# Patient Record
Sex: Female | Born: 1990 | Race: White | Hispanic: No | Marital: Married | Smoking: Never smoker
Health system: Southern US, Community
[De-identification: ages and names within clinical notes are randomized; demographics above are authoritative.]

## PROBLEM LIST (undated history)

## (undated) HISTORY — PX: WISDOM TOOTH EXTRACTION: SHX21

---

## 2016-11-13 ENCOUNTER — Emergency Department (INDEPENDENT_AMBULATORY_CARE_PROVIDER_SITE_OTHER): Payer: Commercial Managed Care - HMO

## 2016-11-13 ENCOUNTER — Encounter: Payer: Self-pay | Admitting: Emergency Medicine

## 2016-11-13 ENCOUNTER — Emergency Department
Admission: EM | Admit: 2016-11-13 | Discharge: 2016-11-13 | Disposition: A | Discharge status: Home / Self Care | Payer: Commercial Managed Care - HMO | Source: Home / Self Care | Attending: Family Medicine | Admitting: Family Medicine

## 2016-11-13 DIAGNOSIS — M25552 Pain in left hip: Secondary | ICD-10-CM

## 2016-11-13 DIAGNOSIS — M545 Low back pain, unspecified: Secondary | ICD-10-CM

## 2016-11-13 LAB — POCT URINE PREGNANCY: Preg Test, Ur: NEGATIVE

## 2016-11-13 MED ORDER — CYCLOBENZAPRINE HCL 10 MG PO TABS: 10.0000 mg | ORAL_TABLET | Freq: Two times a day (BID) | ORAL | 0 refills | Status: AC | PRN

## 2016-11-13 MED ORDER — HYDROCODONE-ACETAMINOPHEN 5-325 MG PO TABS: 1.0000 | ORAL_TABLET | Freq: Four times a day (QID) | ORAL | 0 refills | Status: AC | PRN

## 2016-11-13 NOTE — ED Provider Notes (Signed)
CSN: [id number]     Arrival date & time 11/13/16  1120 History   First MD Initiated Contact with Patient 11/13/16 1154     Chief Complaint  Patient presents with  . Hip Pain   (Consider location/radiation/quality/duration/timing/severity/associated sxs/prior Treatment) HPI  Veronica Robinson is a 26 y.o. female presenting to UC with c/o Left hip pain that started yesterday after she tripped and fell going up some brick steps into her house while carrying her child in her Left arm and grocery bags in her Right.  She notes she was focused on protecting her daughter during the fall and does not recall hitting her hip on anything but it is very sore, no relief with ibuprofen.  Pain is 7/10 at this time, worse with certain movements and in certain positions. She notes she has been advised she has lot bone density as well as her 26 year old sister. Pt notes she fractured her Right patella after "not doing much of anything" a few years ago so she wants to make sure she did not fracture her Left hip.  No radiation of pain into lower leg. No change in bowel or bladder habits.    History reviewed. No pertinent past medical history. Past Surgical History:  Procedure Laterality Date  . CESAREAN SECTION    . WISDOM TOOTH EXTRACTION     No family history on file. Social History  Substance Use Topics  . Smoking status: Never Smoker  . Smokeless tobacco: Never Used  . Alcohol use No   OB History    No data available     Review of Systems  Musculoskeletal: Positive for arthralgias, back pain and myalgias. Negative for gait problem, joint swelling and neck pain.  Skin: Negative for color change and rash.  Neurological: Negative for weakness and numbness.    Allergies  Patient has no known allergies.  Home Medications   Prior to Admission medications   Medication Sig Start Date End Date Taking? Authorizing Provider  clomiPRAMINE (ANAFRANIL) 25 MG capsule Take 25 mg by mouth at bedtime.   Yes  [provider]  LamoTRIgine (LAMICTAL PO) Take by mouth.   Yes [provider]  norethindrone-ethinyl estradiol (FEMHRT 1/5) 1-5 MG-MCG TABS tablet Take by mouth daily.   Yes [provider]  cyclobenzaprine (FLEXERIL) 10 MG tablet Take 1 tablet (10 mg total) by mouth 2 (two) times daily as needed. 11/13/16   Junius Finner'Malley, Emberlie Gotcher, PA-C  HYDROcodone-acetaminophen (NORCO/VICODIN) 5-325 MG tablet Take 1-2 tablets by mouth every 6 (six) hours as needed for moderate pain or severe pain. 11/13/16   Junius Finner'Malley, Felesha Moncrieffe, PA-C   Meds Ordered and Administered this Visit  Medications - No data to display  BP 139/84 (BP Location: Left Arm)   Pulse 86   Temp 98.4 F (36.9 C) (Oral)   Ht 5\' 1"  (1.549 m)   Wt 206 lb (93.4 kg)   LMP 11/10/2016 (Exact Date)   BMI 38.92 kg/m  No data found.   Physical Exam  Constitutional: She is oriented to person, place, and time. She appears well-developed and well-nourished. No distress.  HENT:  Head: Normocephalic and atraumatic.  Eyes: EOM are normal.  Neck: Normal range of motion. Neck supple.  No midline bone tenderness, no crepitus or step-offs.   Cardiovascular: Normal rate.   Pulmonary/Chest: Effort normal.  Musculoskeletal: Normal range of motion. She exhibits tenderness. She exhibits no edema.  Tenderness to lower lumbar spine and Left side lower back. Tenderness to Left hip.  Unable to palpate bone due to body habitus.  Full ROM upper and lower extremities   Neurological: She is alert and oriented to person, place, and time.  Skin: Skin is warm and dry. Capillary refill takes less than 2 seconds. She is not diaphoretic.  Psychiatric: She has a normal mood and affect. Her behavior is normal.  Nursing note and vitals reviewed.   Urgent Care Course     Procedures (including critical care time)  Labs Review Labs Reviewed  POCT URINE PREGNANCY    Imaging Review Dg Lumbar Spine Complete  Result Date: 11/13/2016 CLINICAL DATA:   Pt states she tripped and fell yesterday and landed on some brick steps and injured her lower back. Complaining of left-sided low back pain. Pain increases when bearing weight. EXAM: LUMBAR SPINE - COMPLETE 4+ VIEW COMPARISON:  None. FINDINGS: There is no evidence of lumbar spine fracture. Alignment is normal. Intervertebral disc spaces are maintained. IMPRESSION: Negative. Electronically Signed   By: Amie Portland M.D.   On: 11/13/2016 12:30   Dg Hip Unilat W Or Wo Pelvis 2-3 Views Left  Result Date: 11/13/2016 CLINICAL DATA:  Pt states she tripped and fell yesterday and landed on some brick steps and injured her left hip. C/o lateral left hip pain. Pain increases when bearing weight. EXAM: DG HIP (WITH OR WITHOUT PELVIS) 2-3V LEFT COMPARISON:  None. FINDINGS: There is no evidence of hip fracture or dislocation. There is no evidence of arthropathy or other focal bone abnormality. IMPRESSION: Negative. Electronically Signed   By: Amie Portland M.D.   On: 11/13/2016 12:29     MDM   1. Left hip pain   2. Acute left-sided low back pain without sciatica    Plain films: no acute injury.  Reassured pt. Advised she likely strained the muscles in lower back and Left hip.  Rx: Flexeril and norco Continue to take ibuprofen. F/u with Sports Medicine later this week if not improving.     Junius Finner, PA-C 11/13/16 [phone]

## 2016-11-13 NOTE — ED Triage Notes (Signed)
Pt fell yesterday, injured left hip and leg now radiates down to knee cap.  No relief from ice, heat or ibuprofen.

## 2016-11-13 NOTE — Discharge Instructions (Signed)
°  Norco/Vicodin (hydrocodone-acetaminophen) is a narcotic pain medication, do not combine these medications with others containing tylenol. While taking, do not drink alcohol, drive, or perform any other activities that requires focus while taking these medications.   Flexeril is a muscle relaxer and may cause drowsiness. Do not drink alcohol, drive, or operate heavy machinery while taking.  You may take 400-600mg  Ibuprofen every 6-8 hours as needed for pain as well, especially during the day.

## 2018-09-28 IMAGING — DX DG LUMBAR SPINE COMPLETE 4+V
5 series · 5 of 5 positions shown · non-contrast
Comparison: None.

CLINICAL DATA: Pt states she tripped and fell yesterday and landed
on some brick steps and injured her lower back. Complaining of
left-sided low back pain. Pain increases when bearing weight.

EXAM:
LUMBAR SPINE - COMPLETE 4+ VIEW

[l-spine ap]
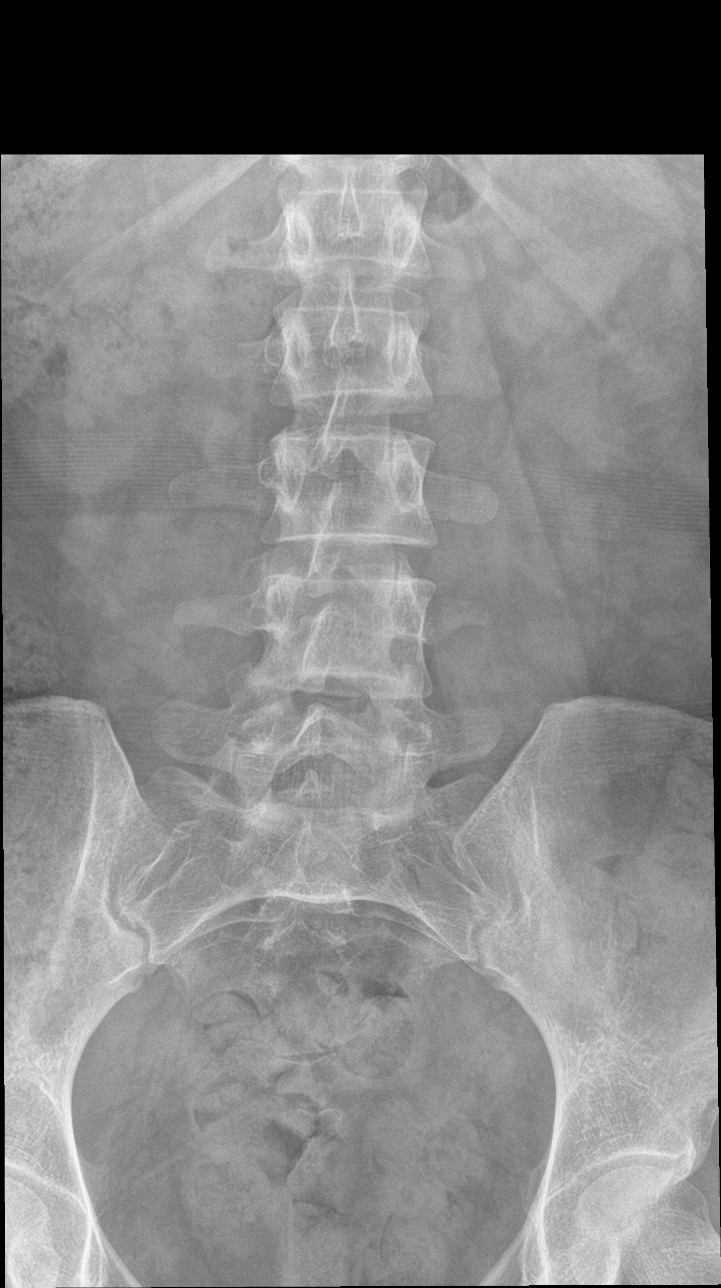

[l-spine obl (1 of 2)]
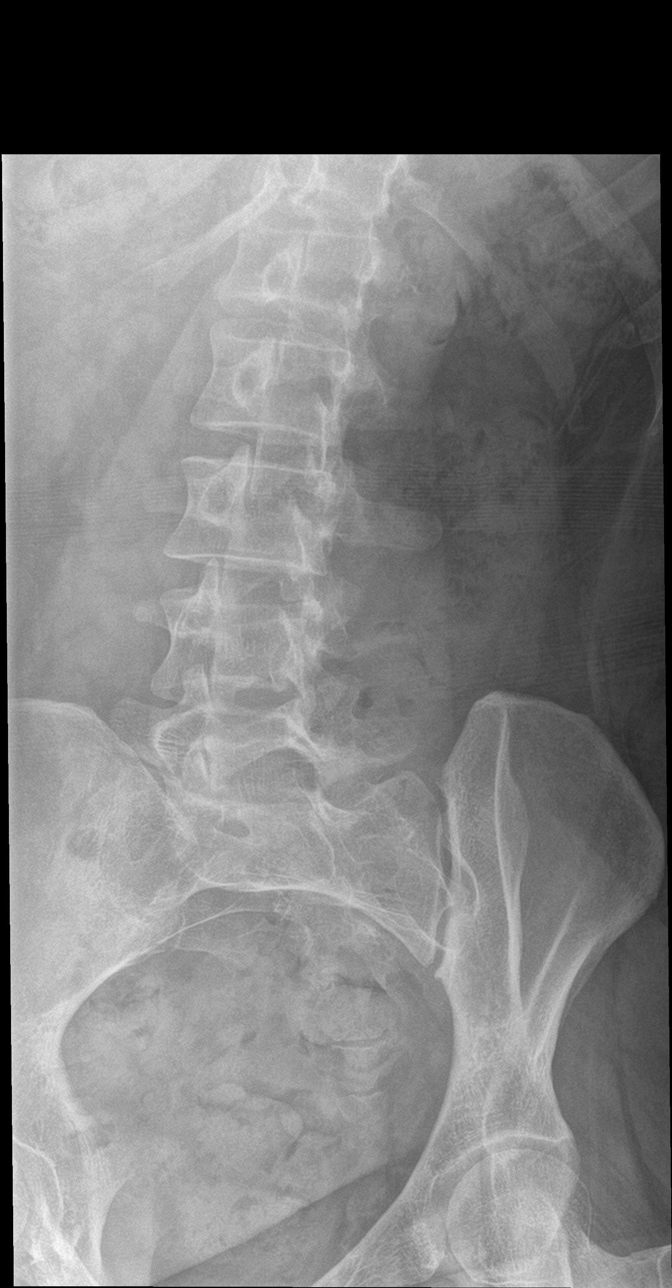

[l-spine obl (2 of 2)]
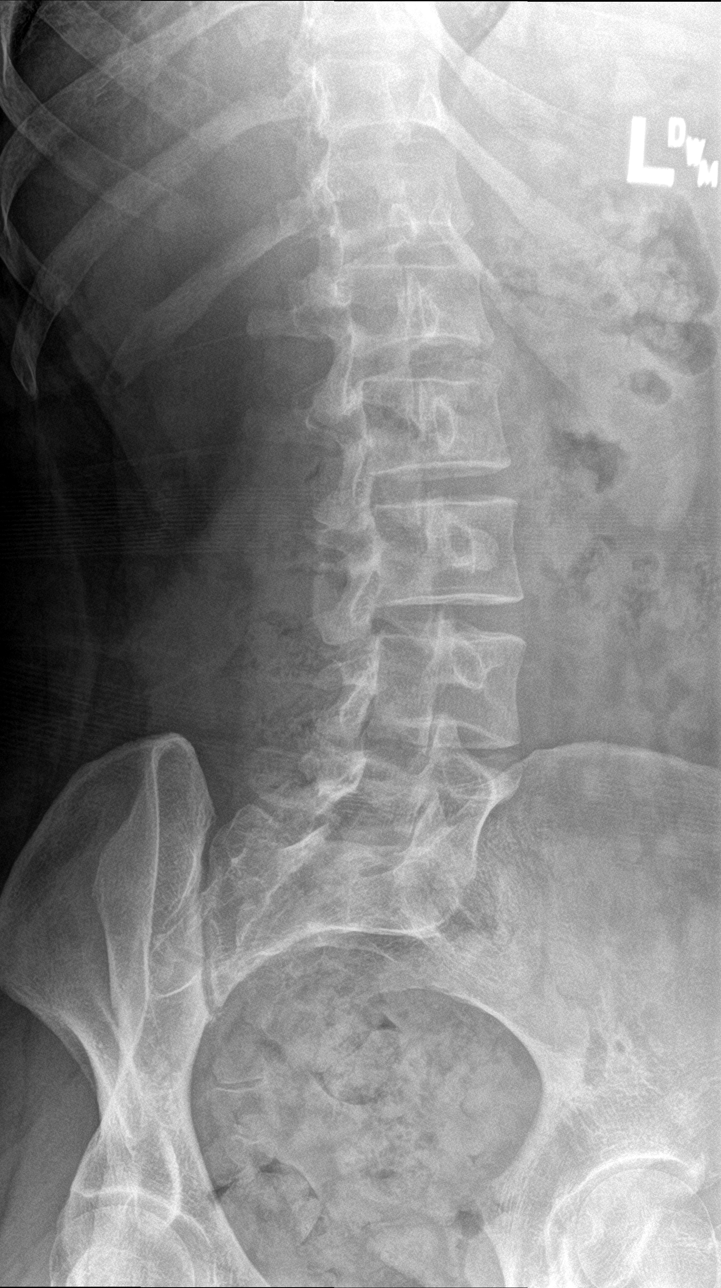

[l-spine lat]
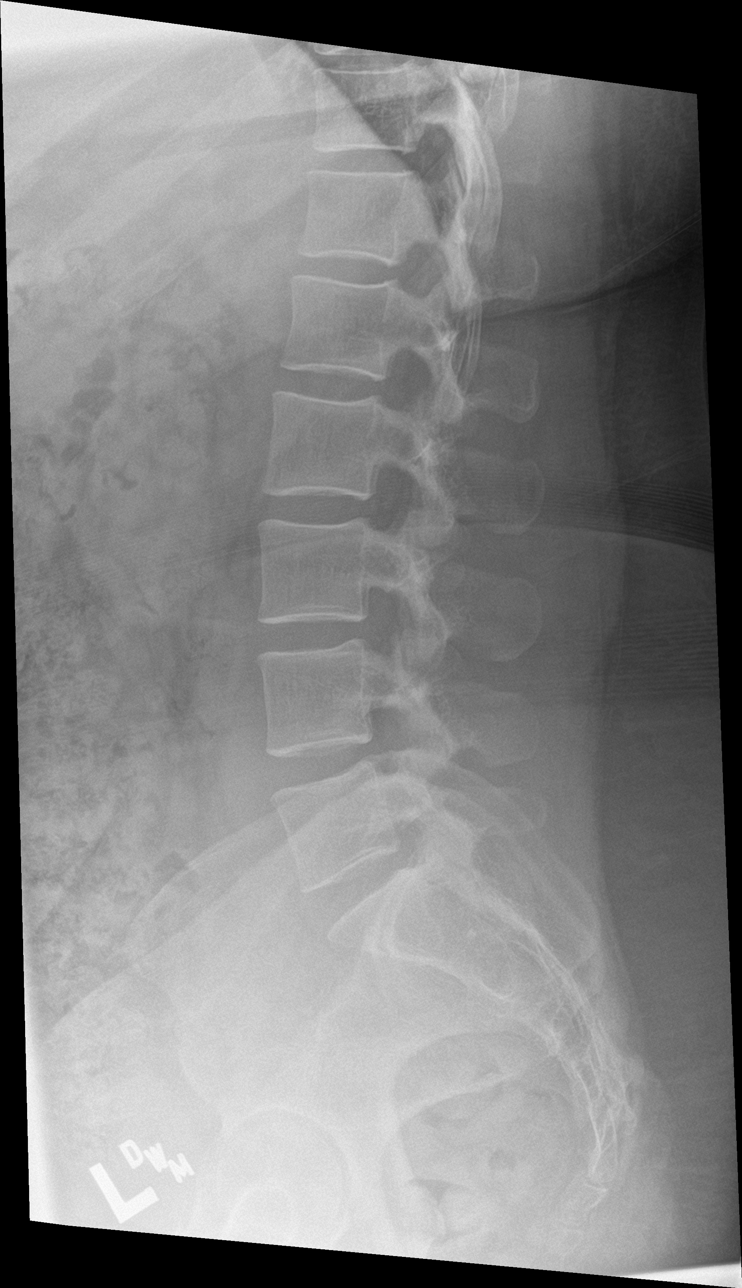

[l-spine spot]
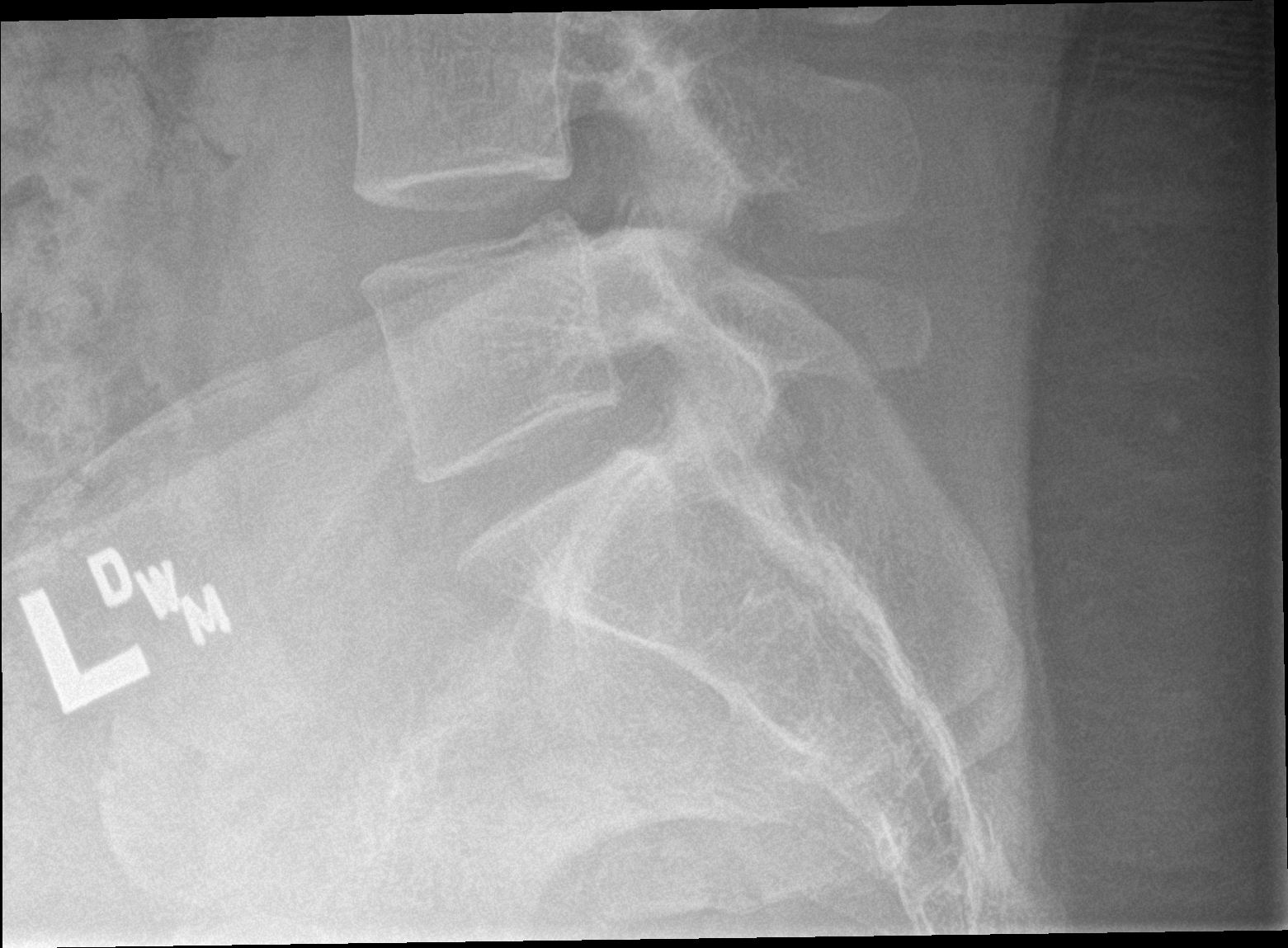

[5 of 5 positions shown; findings below may reference images not displayed]

FINDINGS: There is no evidence of lumbar spine fracture. Alignment is normal.
Intervertebral disc spaces are maintained.
IMPRESSION: Negative.
